# Patient Record
Sex: Female | Born: 1990 | Race: Black or African American | Hispanic: No | Smoking: Never smoker
Health system: Southern US, Community
[De-identification: ages and names within clinical notes are randomized; demographics above are authoritative.]

## PROBLEM LIST (undated history)

## (undated) DIAGNOSIS — J40 Bronchitis, not specified as acute or chronic: Secondary | ICD-10-CM

---

## 2012-07-17 ENCOUNTER — Encounter (HOSPITAL_COMMUNITY): Payer: Self-pay | Admitting: *Deleted

## 2012-07-17 ENCOUNTER — Emergency Department (HOSPITAL_COMMUNITY)
Admission: EM | Admit: 2012-07-17 | Discharge: 2012-07-17 | Disposition: A | Discharge status: Home / Self Care | Payer: BC Managed Care – PPO | Source: Home / Self Care | Attending: Family Medicine | Admitting: Family Medicine

## 2012-07-17 DIAGNOSIS — J40 Bronchitis, not specified as acute or chronic: Secondary | ICD-10-CM

## 2012-07-17 HISTORY — DX: Bronchitis, not specified as acute or chronic: J40

## 2012-07-17 MED ORDER — ALBUTEROL SULFATE HFA 108 (90 BASE) MCG/ACT IN AERS: 2.0000 | INHALATION_SPRAY | RESPIRATORY_TRACT | Status: DC | PRN

## 2012-07-17 MED ORDER — CETIRIZINE-PSEUDOEPHEDRINE ER 5-120 MG PO TB12: 1.0000 | ORAL_TABLET | Freq: Two times a day (BID) | ORAL | Status: AC

## 2012-07-17 MED ORDER — SALINE NASAL SPRAY 0.65 % NA SOLN: 1.0000 | NASAL | Status: DC | PRN

## 2012-07-17 NOTE — ED Provider Notes (Signed)
Medical screening examination/treatment/procedure(s) were performed by resident physician or non-physician practitioner and as supervising physician I was immediately available for consultation/collaboration.   Barkley Bruns MD.    Linna Hoff, MD 07/17/12 [phone]

## 2012-07-17 NOTE — ED Notes (Signed)
Pt with c/o cough/congestion/fever/body aches intermittent loss of voice onset Wednesday taking otc meds without relief -

## 2012-07-17 NOTE — ED Provider Notes (Signed)
History     CSN: [id number]  Arrival date & time 07/17/12  4098   First MD Initiated Contact with Patient 07/17/12 [phone]      Chief Complaint  Patient presents with  . Nasal Congestion  . Cough  . Fever  . Generalized Body Aches  . Laryngitis    (Consider location/radiation/quality/duration/timing/severity/associated sxs/prior treatment) Patient is a 21 y.o. female presenting with cough and fever. The history is provided by the patient.  Cough  Fever Primary symptoms of the febrile illness include fever and cough.  Christiona is a 21 y.o. female who complains of onset of cold symptoms for 3 days.  Symptoms not relieved with OTC medications. + sore throat + cough, non productive No pleuritic pain + wheezing + nasal congestion No post-nasal drainage + sinus pain/pressure + voice changes No chest congestion + itchy/red eyes No earache No hemoptysis + SOB + chills/sweats No fever No nausea No vomiting No abdominal pain No diarrhea No skin rashes No fatigue + myalgias No headache  No ill contacts   Past Medical History  Diagnosis Date  . Bronchitis     History reviewed. No pertinent past surgical history.  History reviewed. No pertinent family history.  History  Substance Use Topics  . Smoking status: Never Smoker   . Smokeless tobacco: Not on file  . Alcohol Use: No    OB History    Grav Para Term Preterm Abortions TAB SAB Ect Mult Living                  Review of Systems  Constitutional: Positive for fever.  Respiratory: Positive for cough.   All other systems reviewed and are negative.    Allergies  Review of patient's allergies indicates no known allergies.  Home Medications   Current Outpatient Rx  Name Route Sig Dispense Refill  . ACETAMINOPHEN 325 MG PO TABS Oral Take 650 mg by mouth every 6 (six) hours as needed.    Marland Kitchen ALKA-SELTZER PLUS ALLERGY PO Oral Take by mouth.    Marland Kitchen DEPO-PROVERA IM Intramuscular Inject into the muscle.    .  ALBUTEROL SULFATE HFA 108 (90 BASE) MCG/ACT IN AERS Inhalation Inhale 2 puffs into the lungs every 4 (four) hours as needed for wheezing. 1 Inhaler 0  . CETIRIZINE-PSEUDOEPHEDRINE ER 5-120 MG PO TB12 Oral Take 1 tablet by mouth 2 (two) times daily. 30 tablet 0  . SALINE NASAL SPRAY 0.65 % NA SOLN Nasal Place 1 spray into the nose as needed for congestion. 30 mL 12    BP 118/68  Pulse 110  Temp 99 F (37.2 C) (Oral)  Resp 22  SpO2 100%  Physical Exam  Nursing note and vitals reviewed. Constitutional: She is oriented to person, place, and time. Vital signs are normal. She appears well-developed and well-nourished. She is active and cooperative.  HENT:  Head: Normocephalic.  Right Ear: External ear normal.  Left Ear: External ear normal.  Nose: Rhinorrhea present. Right sinus exhibits no maxillary sinus tenderness and no frontal sinus tenderness. Left sinus exhibits no maxillary sinus tenderness and no frontal sinus tenderness.  Mouth/Throat: Posterior oropharyngeal erythema present. No oropharyngeal exudate.       Post nasal gtt  Eyes: Conjunctivae are normal. Pupils are equal, round, and reactive to light. No scleral icterus.  Neck: Trachea normal and normal range of motion. Neck supple.  Cardiovascular: Normal rate, regular rhythm and normal heart sounds.   Pulmonary/Chest: Effort normal. No respiratory distress. She has no wheezes.  She exhibits no tenderness.  Abdominal: Soft. Bowel sounds are normal. There is no tenderness.  Lymphadenopathy:    She has no cervical adenopathy.  Neurological: She is alert and oriented to person, place, and time. No cranial nerve deficit or sensory deficit.  Skin: Skin is warm and dry. No rash noted.  Psychiatric: She has a normal mood and affect. Her speech is normal and behavior is normal. Judgment and thought content normal. Cognition and memory are normal.    ED Course  Procedures (including critical care time)  Labs Reviewed - No data to  display No results found.   1. Bronchitis       MDM  Increase fluid intake, rest. Begin expectorant/decongestant, saline nasal spray and/or saline irrigation, and cough suppressant at bedtime. Antihistamines of your choice (Claritin or Zyrtec).  Albuterol as needed for cough and wheezing.  Tylenol or Motrin for fever/discomfort.  Followup with PCP if not improving 7 to 10 days.         Johnsie Kindred, NP 07/17/12 1056

## 2015-04-17 ENCOUNTER — Encounter (HOSPITAL_COMMUNITY): Payer: Self-pay | Admitting: Emergency Medicine

## 2015-04-17 ENCOUNTER — Emergency Department (HOSPITAL_COMMUNITY)
Admission: EM | Admit: 2015-04-17 | Discharge: 2015-04-17 | Disposition: A | Discharge status: Home / Self Care | Payer: BLUE CROSS/BLUE SHIELD | Source: Home / Self Care | Attending: Emergency Medicine | Admitting: Emergency Medicine

## 2015-04-17 DIAGNOSIS — J039 Acute tonsillitis, unspecified: Secondary | ICD-10-CM

## 2015-04-17 LAB — POCT INFECTIOUS MONO SCREEN: MONO SCREEN: NEGATIVE

## 2015-04-17 LAB — POCT RAPID STREP A: Streptococcus, Group A Screen (Direct): NEGATIVE

## 2015-04-17 MED ORDER — CLINDAMYCIN HCL 300 MG PO CAPS: 300.0000 mg | ORAL_CAPSULE | Freq: Three times a day (TID) | ORAL | Status: DC

## 2015-04-17 NOTE — ED Provider Notes (Signed)
CSN: [id number]     Arrival date & time 04/17/15  0800 History   First MD Initiated Contact with Patient 04/17/15 0809     Chief Complaint  Patient presents with  . Sore Throat   (Consider location/radiation/quality/duration/timing/severity/associated sxs/prior Treatment) HPI  She is a 24 year old woman here for evaluation of sore throat. Her symptoms started last night with sore throat and headache. She denies nasal congestion, rhinorrhea, cough. No fevers. No nausea or vomiting. It is quite painful to swallow. She saw white spots on her tonsils last night. She is able to tolerate sips of liquid. She has not tried anything. She had a similar episode about 2 weeks ago. At that time, she self medicated with Tylenol and ibuprofen in the sore throat resolved in about a week.  Past Medical History  Diagnosis Date  . Bronchitis    History reviewed. No pertinent past surgical history. History reviewed. No pertinent family history. History  Substance Use Topics  . Smoking status: Never Smoker   . Smokeless tobacco: Not on file  . Alcohol Use: No   OB History    No data available     Review of Systems As in history of present illness Allergies  Review of patient's allergies indicates no known allergies.  Home Medications   Prior to Admission medications   Medication Sig Start Date End Date Taking? Authorizing Provider  acetaminophen (TYLENOL) 325 MG tablet Take 650 mg by mouth every 6 (six) hours as needed.    Historical Provider, MD  albuterol (PROVENTIL HFA;VENTOLIN HFA) 108 (90 BASE) MCG/ACT inhaler Inhale 2 puffs into the lungs every 4 (four) hours as needed for wheezing. 07/17/12 07/17/13  Johnsie Kindredarmen L Chatten, NP  clindamycin (CLEOCIN) 300 MG capsule Take 1 capsule (300 mg total) by mouth 3 (three) times daily. 04/17/15   Charm RingsErin J Honig, MD  DiphenhydrAMINE HCl (ALKA-SELTZER PLUS ALLERGY PO) Take by mouth.    Historical Provider, MD  MedroxyPROGESTERone Acetate (DEPO-PROVERA IM) Inject into  the muscle.    Historical Provider, MD  sodium chloride (OCEAN NASAL SPRAY) 0.65 % nasal spray Place 1 spray into the nose as needed for congestion. 07/17/12 07/17/13  Katherine Bassetarmen L Chatten, NP   BP 141/94 mmHg  Pulse 113  Temp(Src) 98.4 F (36.9 C) (Oral)  Resp 16  SpO2 97% Physical Exam  Constitutional: She is oriented to person, place, and time. She appears well-developed and well-nourished. No distress.  HENT:  Nose: Nose normal.  Mouth/Throat: No oropharyngeal exudate.  Tonsils are enlarged and erythematous  Neck: Neck supple.  Cardiovascular: Regular rhythm and normal heart sounds.   No murmur heard. Mildly tachycardic  Pulmonary/Chest: Effort normal and breath sounds normal. No respiratory distress. She has no wheezes. She has no rales.  Lymphadenopathy:    She has cervical adenopathy (bilateral anterior chain).  Neurological: She is alert and oriented to person, place, and time.    ED Course  Procedures (including critical care time) Labs Review Labs Reviewed  POCT RAPID STREP A  POCT INFECTIOUS MONO SCREEN    Imaging Review No results found.   MDM   1. Tonsillitis    Rapid strep and mono screen are negative. I'm concerned for atypical bacterial tonsillitis. Will treat with clindamycin for 7 days. Discussed live culture yogurt or probiotic to help with diarrhea. Symptomatic care discussed as in after visit summary. Follow-up as needed.    Charm RingsErin J Honig, MD 04/17/15 [phone]

## 2015-04-17 NOTE — ED Notes (Signed)
C/o sore throat since yesterday for a couple of weeks now States she has white patches on the back of throat otc meds used as tx

## 2015-04-17 NOTE — Discharge Instructions (Signed)
You have an infection of your tonsils. Take clindamycin 3 times a day for the next 7 days. Eat a live culture yogurt daily or take a probiotic while on clindamycin to decrease the risk of diarrhea. Use Chloraseptic spray every one to 2 hours for throat pain. You can also do saltwater gargles and tea with honey. Follow-up as needed.

## 2015-04-19 LAB — CULTURE, GROUP A STREP: STREP A CULTURE: POSITIVE — AB

## 2015-04-22 NOTE — ED Notes (Signed)
Final report of strep positive. Treatment adequate w Rx provided.

## 2015-05-23 ENCOUNTER — Emergency Department (HOSPITAL_COMMUNITY)
Admission: EM | Admit: 2015-05-23 | Discharge: 2015-05-23 | Disposition: A | Discharge status: Home / Self Care | Payer: BLUE CROSS/BLUE SHIELD | Source: Home / Self Care | Attending: Family Medicine | Admitting: Family Medicine

## 2015-05-23 ENCOUNTER — Encounter (HOSPITAL_COMMUNITY): Payer: Self-pay | Admitting: Emergency Medicine

## 2015-05-23 DIAGNOSIS — J020 Streptococcal pharyngitis: Secondary | ICD-10-CM

## 2015-05-23 LAB — POCT RAPID STREP A: Streptococcus, Group A Screen (Direct): NEGATIVE

## 2015-05-23 MED ORDER — CEFDINIR 300 MG PO CAPS: 300.0000 mg | ORAL_CAPSULE | Freq: Two times a day (BID) | ORAL | Status: DC

## 2015-05-23 NOTE — ED Provider Notes (Signed)
CSN: [id number]     Arrival date & time 05/23/15  1946 History   First MD Initiated Contact with Patient 05/23/15 2110     Chief Complaint  Patient presents with  . Sore Throat  . GI Problem   (Consider location/radiation/quality/duration/timing/severity/associated sxs/prior Treatment) Patient is a 24 y.o. female presenting with pharyngitis and GI illness. The history is provided by the patient.  Sore Throat This is a recurrent problem. The current episode started yesterday. The problem has not changed since onset.Associated symptoms include abdominal pain.  GI Problem Associated symptoms include abdominal pain.    Past Medical History  Diagnosis Date  . Bronchitis    History reviewed. No pertinent past surgical history. No family history on file. History  Substance Use Topics  . Smoking status: Never Smoker   . Smokeless tobacco: Not on file  . Alcohol Use: No   OB History    No data available     Review of Systems  Constitutional: Positive for appetite change. Negative for fever and chills.  HENT: Negative.   Gastrointestinal: Positive for nausea, vomiting, abdominal pain and diarrhea.  Musculoskeletal: Negative.   Skin: Negative.     Allergies  Review of patient's allergies indicates no known allergies.  Home Medications   Prior to Admission medications   Medication Sig Start Date End Date Taking? Authorizing Provider  acetaminophen (TYLENOL) 325 MG tablet Take 650 mg by mouth every 6 (six) hours as needed.    Historical Provider, MD  albuterol (PROVENTIL HFA;VENTOLIN HFA) 108 (90 BASE) MCG/ACT inhaler Inhale 2 puffs into the lungs every 4 (four) hours as needed for wheezing. 07/17/12 07/17/13  Johnsie Kindredarmen L Chatten, NP  cefdinir (OMNICEF) 300 MG capsule Take 1 capsule (300 mg total) by mouth 2 (two) times daily. 05/23/15   Linna HoffJames D Lahari Suttles, MD  clindamycin (CLEOCIN) 300 MG capsule Take 1 capsule (300 mg total) by mouth 3 (three) times daily. 04/17/15   Charm RingsErin J Honig, MD   DiphenhydrAMINE HCl (ALKA-SELTZER PLUS ALLERGY PO) Take by mouth.    Historical Provider, MD  MedroxyPROGESTERone Acetate (DEPO-PROVERA IM) Inject into the muscle.    Historical Provider, MD  sodium chloride (OCEAN NASAL SPRAY) 0.65 % nasal spray Place 1 spray into the nose as needed for congestion. 07/17/12 07/17/13  Katherine Bassetarmen L Chatten, NP   BP 124/71 mmHg  Pulse 113  Temp(Src) 98.7 F (37.1 C) (Oral)  Resp 16  SpO2 100% Physical Exam  Constitutional: She is oriented to person, place, and time. She appears well-developed and well-nourished. No distress.  HENT:  Right Ear: External ear normal.  Left Ear: External ear normal.  Mouth/Throat: Uvula is midline and mucous membranes are normal. Oropharyngeal exudate and posterior oropharyngeal erythema present.  Neck: Normal range of motion. Neck supple.  Lymphadenopathy:    She has cervical adenopathy.  Neurological: She is alert and oriented to person, place, and time.  Skin: Skin is warm and dry.  Nursing note and vitals reviewed.   ED Course  Procedures (including critical care time) Labs Review Labs Reviewed  POCT RAPID STREP A  strep neg.  Imaging Review No results found.   MDM   1. Strep sore throat       Linna HoffJames D Bri Wakeman, MD 05/23/15 2150

## 2015-05-23 NOTE — ED Notes (Signed)
Pt comes in with tonsillitis that started last night followed with GI sx's Diarrhea and episode of vomiting noted No fever,chills noted  Afebrile

## 2015-05-23 NOTE — Discharge Instructions (Signed)
Drink lots of fluids, take all of medicine, use lozenges as needed.return if needed or see ent specialist noted.

## 2015-05-26 LAB — CULTURE, GROUP A STREP: STREP A CULTURE: POSITIVE — AB

## 2015-05-27 NOTE — ED Notes (Signed)
Final report of culture positive for strep, treatment adequate w omnicef

## 2020-05-13 ENCOUNTER — Other Ambulatory Visit: Payer: Self-pay

## 2020-05-13 ENCOUNTER — Encounter (HOSPITAL_BASED_OUTPATIENT_CLINIC_OR_DEPARTMENT_OTHER): Payer: Self-pay | Admitting: *Deleted

## 2020-05-13 ENCOUNTER — Emergency Department (HOSPITAL_COMMUNITY)
Admission: EM | Admit: 2020-05-13 | Discharge: 2020-05-13 | Discharge status: Against Medical Advice | Payer: No Typology Code available for payment source

## 2020-05-13 ENCOUNTER — Emergency Department (HOSPITAL_BASED_OUTPATIENT_CLINIC_OR_DEPARTMENT_OTHER): Payer: BC Managed Care – PPO

## 2020-05-13 DIAGNOSIS — M542 Cervicalgia: Secondary | ICD-10-CM | POA: Diagnosis not present

## 2020-05-13 DIAGNOSIS — M25511 Pain in right shoulder: Secondary | ICD-10-CM | POA: Diagnosis not present

## 2020-05-13 DIAGNOSIS — Y939 Activity, unspecified: Secondary | ICD-10-CM | POA: Insufficient documentation

## 2020-05-13 DIAGNOSIS — M549 Dorsalgia, unspecified: Secondary | ICD-10-CM | POA: Diagnosis present

## 2020-05-13 DIAGNOSIS — Y999 Unspecified external cause status: Secondary | ICD-10-CM | POA: Diagnosis not present

## 2020-05-13 DIAGNOSIS — M25512 Pain in left shoulder: Secondary | ICD-10-CM | POA: Diagnosis not present

## 2020-05-13 DIAGNOSIS — Y92410 Unspecified street and highway as the place of occurrence of the external cause: Secondary | ICD-10-CM | POA: Diagnosis not present

## 2020-05-13 LAB — PREGNANCY, URINE: Preg Test, Ur: NEGATIVE
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
## 2020-05-13 NOTE — ED Notes (Signed)
Pt stated she would  Go be seen elsewhere and has let the premises.

## 2020-05-13 NOTE — ED Notes (Signed)
Pt did not respond to name being called for triage

## 2020-05-13 NOTE — ED Notes (Signed)
Pt checked out against medical advise.

## 2020-05-14 ENCOUNTER — Encounter (HOSPITAL_BASED_OUTPATIENT_CLINIC_OR_DEPARTMENT_OTHER): Payer: Self-pay | Admitting: Emergency Medicine

## 2020-05-14 ENCOUNTER — Emergency Department (HOSPITAL_BASED_OUTPATIENT_CLINIC_OR_DEPARTMENT_OTHER)
Admission: EM | Admit: 2020-05-14 | Discharge: 2020-05-14 | Disposition: A | Discharge status: Home / Self Care | Payer: BC Managed Care – PPO | Attending: Emergency Medicine | Admitting: Emergency Medicine

## 2020-05-14 MED ORDER — NAPROXEN 250 MG PO TABS
500.0000 mg | ORAL_TABLET | Freq: Once | ORAL | Status: AC
  Administered 2020-05-14: 500 mg via ORAL
  Filled 2020-05-14: qty 2

## 2020-05-14 MED ORDER — METHOCARBAMOL 500 MG PO TABS: 500.0000 mg | ORAL_TABLET | Freq: Two times a day (BID) | ORAL | 0 refills | Status: DC

## 2020-05-14 MED ORDER — NAPROXEN 375 MG PO TABS: 375.0000 mg | ORAL_TABLET | Freq: Two times a day (BID) | ORAL | 0 refills | Status: DC

## 2020-05-14 MED ORDER — METHOCARBAMOL 500 MG PO TABS
500.0000 mg | ORAL_TABLET | Freq: Once | ORAL | Status: AC
  Administered 2020-05-14: 500 mg via ORAL
  Filled 2020-05-14: qty 1

## 2020-05-14 MED ORDER — ACETAMINOPHEN 500 MG PO TABS
1000.0000 mg | ORAL_TABLET | Freq: Once | ORAL | Status: AC
  Administered 2020-05-14: 1000 mg via ORAL
  Filled 2020-05-14: qty 2

## 2020-05-14 MED ORDER — LIDOCAINE 5 % EX PTCH
1.0000 | MEDICATED_PATCH | CUTANEOUS | Status: DC
  Administered 2020-05-14: 1 via TRANSDERMAL
  Filled 2020-05-14: qty 1

## 2020-05-14 MED ORDER — LIDOCAINE 5 % EX PTCH: 1.0000 | MEDICATED_PATCH | CUTANEOUS | 0 refills | Status: DC
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
## 2020-09-05 ENCOUNTER — Ambulatory Visit: Payer: BC Managed Care – PPO | Admitting: Family Medicine

## 2020-09-05 ENCOUNTER — Encounter: Payer: Self-pay | Admitting: Family Medicine

## 2020-09-05 ENCOUNTER — Other Ambulatory Visit: Payer: Self-pay

## 2020-09-05 VITALS — BP 130/82 | HR 108 | Temp 98.5°F | Wt 187.0 lb

## 2020-09-05 DIAGNOSIS — M5442 Lumbago with sciatica, left side: Secondary | ICD-10-CM

## 2020-09-05 DIAGNOSIS — M542 Cervicalgia: Secondary | ICD-10-CM

## 2020-09-05 DIAGNOSIS — R519 Headache, unspecified: Secondary | ICD-10-CM | POA: Diagnosis not present

## 2020-09-05 DIAGNOSIS — G8929 Other chronic pain: Secondary | ICD-10-CM

## 2020-09-05 MED ORDER — METHOCARBAMOL 500 MG PO TABS: 500.0000 mg | ORAL_TABLET | Freq: Two times a day (BID) | ORAL | 0 refills | Status: AC

## 2020-09-05 MED ORDER — IBUPROFEN 800 MG PO TABS: 800.0000 mg | ORAL_TABLET | Freq: Three times a day (TID) | ORAL | 0 refills | Status: AC | PRN
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
## 2020-09-05 NOTE — Patient Instructions (Signed)
Take the 800 mg ibuprofen 3 times daily with food until you see the orthopedist.  You can use Robaxin as needed for moderate or severe pain.  Continue using heat or ice to the areas that are bothering you.

## 2020-09-11 ENCOUNTER — Encounter: Payer: Self-pay | Admitting: Family Medicine

## 2020-09-11 ENCOUNTER — Other Ambulatory Visit: Payer: Self-pay

## 2020-09-11 ENCOUNTER — Ambulatory Visit: Payer: BC Managed Care – PPO | Admitting: Family Medicine

## 2020-09-11 DIAGNOSIS — M545 Low back pain, unspecified: Secondary | ICD-10-CM | POA: Diagnosis not present

## 2020-09-11 DIAGNOSIS — M5412 Radiculopathy, cervical region: Secondary | ICD-10-CM | POA: Diagnosis not present

## 2020-09-11 DIAGNOSIS — M542 Cervicalgia: Secondary | ICD-10-CM

## 2020-09-11 DIAGNOSIS — G8929 Other chronic pain: Secondary | ICD-10-CM
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
## 2020-09-27 ENCOUNTER — Ambulatory Visit
Admission: RE | Admit: 2020-09-27 | Discharge: 2020-09-27 | Disposition: A | Discharge status: Home / Self Care | Payer: BC Managed Care – PPO | Source: Ambulatory Visit | Attending: Family Medicine | Admitting: Family Medicine

## 2020-09-27 DIAGNOSIS — M5412 Radiculopathy, cervical region: Secondary | ICD-10-CM

## 2020-09-27 DIAGNOSIS — M542 Cervicalgia: Secondary | ICD-10-CM

## 2020-09-30 ENCOUNTER — Telehealth: Payer: Self-pay | Admitting: Family Medicine

## 2020-09-30 NOTE — Telephone Encounter (Signed)
MRI of the cervical spine looks good, no ruptured discs, pinched nerves, or degenerative changes.

## 2020-12-05 ENCOUNTER — Other Ambulatory Visit: Payer: Self-pay

## 2020-12-05 ENCOUNTER — Telehealth: Payer: Self-pay | Admitting: Family Medicine

## 2020-12-05 ENCOUNTER — Encounter: Payer: Self-pay | Admitting: Family Medicine

## 2020-12-05 ENCOUNTER — Telehealth: Payer: Self-pay

## 2020-12-05 ENCOUNTER — Telehealth: Payer: BC Managed Care – PPO | Admitting: Family Medicine

## 2020-12-05 ENCOUNTER — Other Ambulatory Visit (INDEPENDENT_AMBULATORY_CARE_PROVIDER_SITE_OTHER): Payer: BC Managed Care – PPO

## 2020-12-05 VITALS — Temp 100.0°F

## 2020-12-05 DIAGNOSIS — J029 Acute pharyngitis, unspecified: Secondary | ICD-10-CM

## 2020-12-05 DIAGNOSIS — R509 Fever, unspecified: Secondary | ICD-10-CM

## 2020-12-05 DIAGNOSIS — R52 Pain, unspecified: Secondary | ICD-10-CM

## 2020-12-05 LAB — POC COVID19 BINAXNOW: SARS Coronavirus 2 Ag: NEGATIVE

## 2020-12-05 LAB — POC INFLUENZA A&B (BINAX/QUICKVUE)
Influenza A, POC: NEGATIVE
Influenza B, POC: NEGATIVE

## 2020-12-05 LAB — POCT RAPID STREP A (OFFICE): Rapid Strep A Screen: NEGATIVE

## 2020-12-05 NOTE — Telephone Encounter (Signed)
I called pt. To let her know her lab results and recommendations to go to urgent care or the ER if symptoms get any worse.

## 2020-12-05 NOTE — Telephone Encounter (Signed)
Pt called back after her appt and states that he looked back and she does have a history of strep throat. Pt can be reached at [phone].

## 2020-12-05 NOTE — Progress Notes (Signed)
   Subjective:  Documentation for virtual audio and video telecommunications through Caregility encounter:  The patient was located at home. 2 patient identifiers used.  The provider was located in the office. The patient did consent to this visit and is aware of possible charges through their insurance for this visit.  The other persons participating in this telemedicine service were none. Time spent on call was 16 minutes and in review of previous records 20 minutes total.  This virtual service is not related to other E/M service within previous 7 days.   Patient ID: Helen Arnold, female    DOB: 22-Jun-1991, 30 y.o.   MRN: [id number]  HPI Chief Complaint  Patient presents with  . possible covid    Negative rapid negative test. Sore throat- x few days, body aches- everything hurts on body. fever   Complains of 2 day history of acute onset of fever, chills, body aches, headache, and severe sore throat.   No rhinorrhea, nasal congestion, sinus pain, ear pain, chest pain, shortness of breath, abdominal pain, N/V/D.   States she has been taking Elderberry, vitamin C, Tylenol 1,000 mg, and Excedrin.   She did not get her flu or Covid vaccines.   States she had a negative rapid covid test at home.   LMP: currently on her cycle. No chance of pregnancy   Reviewed allergies, medications, past medical, surgical, family, and social history.   Review of Systems Pertinent positives and negatives in the history of present illness.     Objective:   Physical Exam Temp 100 F (37.8 C)   She appears to be in distress due to pain. Respirations unlabored. Normal voice.       Assessment & Plan:  Fever and chills - Plan: POC COVID-19 BinaxNow, Novel Coronavirus, NAA ([facility]), POC Influenza A&B(BINAX/QUICKVUE), POCT rapid strep A  Body aches - Plan: POC COVID-19 BinaxNow, Novel Coronavirus, NAA ([facility]), POC Influenza A&B(BINAX/QUICKVUE), POCT rapid strep A  Acute pharyngitis,  unspecified etiology - Plan: POC COVID-19 BinaxNow, Novel Coronavirus, NAA ([facility]), POC Influenza A&B(BINAX/QUICKVUE), POCT rapid strep A  She will come to the office parking lot for testing. Rapid Covid, flu, strep and PCR Covid if negative rapid covid test.  Discussed symptomatic treatment and when to seek immediate medical attention at an UC or ED.

## 2020-12-06 ENCOUNTER — Telehealth: Payer: Self-pay

## 2020-12-06 NOTE — Telephone Encounter (Signed)
Pt was advised to wait on the pcr results. In the mean time treat her symptoms and stay in. Pt was also told to call back on Monday if things take a turn for the worse. KH

## 2020-12-07 LAB — SARS-COV-2, NAA 2 DAY TAT

## 2020-12-07 LAB — NOVEL CORONAVIRUS, NAA: SARS-CoV-2, NAA: NOT DETECTED

## 2020-12-10 ENCOUNTER — Encounter: Payer: Self-pay | Admitting: Family Medicine

## 2020-12-11 ENCOUNTER — Institutional Professional Consult (permissible substitution): Payer: BC Managed Care – PPO | Admitting: Neurology

## 2021-01-09 ENCOUNTER — Encounter: Payer: Self-pay | Admitting: Neurology

## 2021-01-09 ENCOUNTER — Ambulatory Visit: Payer: BC Managed Care – PPO | Admitting: Neurology

## 2021-01-09 ENCOUNTER — Other Ambulatory Visit: Payer: Self-pay

## 2021-01-09 VITALS — BP 112/84 | HR 118 | Ht 61.0 in | Wt 193.0 lb

## 2021-01-09 DIAGNOSIS — G4700 Insomnia, unspecified: Secondary | ICD-10-CM

## 2021-01-09 DIAGNOSIS — R0683 Snoring: Secondary | ICD-10-CM | POA: Diagnosis not present

## 2021-01-09 DIAGNOSIS — J351 Hypertrophy of tonsils: Secondary | ICD-10-CM

## 2021-01-09 DIAGNOSIS — Z9189 Other specified personal risk factors, not elsewhere classified: Secondary | ICD-10-CM

## 2021-01-09 DIAGNOSIS — E669 Obesity, unspecified: Secondary | ICD-10-CM

## 2021-01-09 NOTE — Progress Notes (Signed)
Subjective:    Patient ID: Helen Arnold is a 30 y.o. female.  HPI      Helen Foley, MD, PhD [facility] [address] [address] [place], [place] [address]  Dear Dr. Cherly Arnold,   I saw your patient, Helen Arnold, upon your kind request in my sleep clinic today for initial consultation of her sleep disorder, in particular, The patient is unaccompanied today.  As you know, Helen Arnold is a 30 year old right-handed woman with an underlying medical history of bronchitis, anxiety, recurrent sore throat, tonsillar hypertrophy, and obesity, who reports chronic difficulty initiating sleep for years.  She has been taking over-the-counter medications such as ZzzQuil or Unisom for the past few years, typically alternates and typically takes 1 or the other on a nightly basis.  She falls asleep well after taking the medication.  She admits that she currently does not have a very set schedule.  She has always been a night owl even as a child.  She goes to bed around 2 AM and rise time is around 1130 or noon.  She lives with her fianc.  She is on long-term disability.  She has no children and no pets in the house.  She has had tonsillar hypertrophy and recurrent tonsillitis, had a consultation with ENT in or around 2014.  She was not encouraged to pursue tonsillectomy.  She has no family history of sleep apnea or insomnia.  She does not have night to night nocturia or recurrent morning headaches.  She is working on weight loss.  She has tried melatonin in the past but did not find it helpful.  She does report stress and anxiety and difficulty shutting down at night.  She recently started counseling, had her first session and thought it was beneficial.  She is not currently on any medication for anxiety.  I reviewed your office note from 10/24/20. Her Epworth sleepiness score is 7 out of 24, fatigue severity 4 is 48 out of 63.  She does endorse snoring. She does not smoke or drink  alcohol, she does not drink caffeine daily.  She drinks occasional tea.  Her Past Medical History Is Significant For: Past Medical History:  Diagnosis Date  . Bronchitis     Her Past Surgical History Is Significant For: No past surgical history on file.  Her Family History Is Significant For: No family history on file.  Her Social History Is Significant For: Social History   Socioeconomic History  . Marital status: Significant Other    Spouse name: Not on file  . Number of children: Not on file  . Years of education: Not on file  . Highest education level: Not on file  Occupational History  . Not on file  Tobacco Use  . Smoking status: Never Smoker  . Smokeless tobacco: Never Used  Substance and Sexual Activity  . Alcohol use: Yes    Comment: occasional   . Drug use: No  . Sexual activity: Not on file  Other Topics Concern  . Not on file  Social History Narrative  . Not on file   Social Determinants of Health   Financial Resource Strain: Not on file  Food Insecurity: Not on file  Transportation Needs: Not on file  Physical Activity: Not on file  Stress: Not on file  Social Connections: Not on file    Her Allergies Are:  No Known Allergies:   Her Current Medications Are:  Outpatient Encounter Medications as of 01/09/2021  Medication Sig  . acetaminophen (TYLENOL) 325 MG tablet Take 650 mg by mouth every 6 (six) hours as needed.  Marland Kitchen ibuprofen (ADVIL) 800 MG tablet Take 1 tablet (800 mg total) by mouth every 8 (eight) hours as needed.  . methocarbamol (ROBAXIN) 500 MG tablet Take 1 tablet (500 mg total) by mouth 2 (two) times daily.   No facility-administered encounter medications on file as of 01/09/2021.  :  Review of Systems:  Out of a complete 14 point review of systems, all are reviewed and negative with the exception of these symptoms as listed below: Review of Systems  Neurological:       Here for sleep consult. No prior sleep study, reports she does  snore at night. Reports she does typically take a sleep aid to help her fall asleep at night.  Epworth Sleepiness Scale 0= would never doze 1= slight chance of dozing 2= moderate chance of dozing 3= high chance of dozing  Sitting and reading:2 Watching TV:2 Sitting inactive in a public place (ex. Theater or meeting):0 As a passenger in a car for an hour without a break:2 Lying down to rest in the afternoon:1 Sitting and talking to someone:0 Sitting quietly after lunch (no alcohol):0 In a car, while stopped in traffic:0 Total:7     Objective:  Neurological Exam  Physical Exam Physical Examination:   Vitals:   01/09/21 1519  BP: 112/84  Pulse: (!) 118  SpO2: 98%   General Examination: The patient is a very pleasant 30 y.o. female in no acute distress. She appears well-developed and well-nourished and well groomed.   HEENT: Normocephalic, atraumatic, pupils are equal, round and reactive to light, extraocular tracking is good without limitation to gaze excursion or nystagmus noted. Hearing is grossly intact. Face is symmetric with normal facial animation. Speech is clear with no dysarthria noted. There is no hypophonia. There is no lip, neck/head, jaw or voice tremor. Neck is supple with full range of passive and active motion. There are no carotid bruits on auscultation. Oropharynx exam reveals: mild mouth dryness, adequate dental hygiene and moderate airway crowding, due to tonsillar size of about 3+, Mallampati class II, smaller airway entry noted.  Tongue protrudes centrally and palate elevates symmetrically.  Neck circumference of 14-5/8 inches.  She has a mild overbite.  Nasal inspection reveals mild inferior turbinate hypertrophy bilaterally.  No deviated septum noted.  Chest: Clear to auscultation without wheezing, rhonchi or crackles noted.  Heart: S1+S2+0, regular and normal without murmurs, rubs or gallops noted.   Abdomen: Soft, non-tender and non-distended with normal  bowel sounds appreciated on auscultation.  Extremities: There is no pitting edema in the distal lower extremities bilaterally.   Skin: Warm and dry without trophic changes noted.   Musculoskeletal: exam reveals no obvious joint deformities, tenderness or joint swelling or erythema.   Neurologically:  Mental status: The patient is awake, alert and oriented in all 4 spheres. Her immediate and remote memory, attention, language skills and fund of knowledge are appropriate. There is no evidence of aphasia, agnosia, apraxia or anomia. Speech is clear with normal prosody and enunciation. Thought process is linear. Mood is normal and affect is normal.  Cranial nerves II - XII are as described above under HEENT exam.  Motor exam: Normal bulk, strength and tone is noted. There is no tremor, Romberg is negative. Fine motor skills and coordination: grossly intact.  Cerebellar testing: No dysmetria or intention tremor. There is no truncal or gait ataxia.  Sensory exam:  intact to light touch in the upper and lower extremities.  Gait, station and balance: She stands easily. No veering to one side is noted. No leaning to one side is noted. Posture is age-appropriate and stance is narrow based. Gait shows normal stride length and normal pace. No problems turning are noted. Tandem walk is unremarkable.               Assessment and Plan:   In summary, Getsemani Lindon is a very pleasant 30 y.o.-year old female with an underlying medical history of bronchitis, anxiety, recurrent sore throat, tonsillar hypertrophy, and obesity, who presents for evaluation of her sleep disorder.  Her history and examination suggest high risk for underlying obstructive sleep apnea (OSA). I had a long chat with the patient about my findings and the diagnosis of OSA, its prognosis and treatment options. We talked about medical treatments, surgical interventions and non-pharmacological approaches. I explained in particular the risks and  ramifications of untreated moderate to severe OSA, especially with respect to developing cardiovascular disease down the Road, including congestive heart failure, difficult to treat hypertension, cardiac arrhythmias, or stroke. Even type 2 diabetes has, in part, been linked to untreated OSA. Symptoms of untreated OSA include daytime sleepiness, memory problems, mood irritability and mood disorder such as depression and anxiety, lack of energy, as well as recurrent headaches, especially morning headaches. We talked about trying to maintain a healthy lifestyle in general, as well as the importance of weight control. We also talked about the importance of good sleep hygiene. I recommended the following at this time: sleep study.  I explained the sleep test procedure to the patient and also outlined possible surgical and non-surgical treatment options of OSA, including the use of a custom-made dental device (which would require a referral to a specialist dentist or oral surgeon), upper airway surgical options, such as traditional UPPP or a novel less invasive surgical option in the form of Inspire hypoglossal nerve stimulation (which would involve a referral to an ENT surgeon). I also explained the CPAP treatment option to the patient, who indicated that she would be willing to try CPAP if the need arises. I explained the importance of being compliant with PAP treatment, not only for insurance purposes but primarily to improve Her symptoms, and for the patient's long term health benefit, including to reduce Her cardiovascular risks. I answered all her questions today and the patient was in agreement. I plan to see her back after the sleep study is completed and encouraged her to call with any interim questions, concerns, problems or updates.   Thank you very much for allowing me to participate in the care of this nice patient. If I can be of any further assistance to you please do not hesitate to call me at  [phone].  Sincerely,   Helen Foley, MD, PhD

## 2021-01-09 NOTE — Patient Instructions (Signed)

## 2021-01-15 ENCOUNTER — Telehealth: Payer: Self-pay

## 2021-01-15 NOTE — Telephone Encounter (Signed)
LVM for pt to call me back to schedule sleep study  

## 2021-01-21 ENCOUNTER — Telehealth: Payer: Self-pay

## 2021-01-21 NOTE — Telephone Encounter (Signed)
Calling to schedule sleep study. 

## 2021-01-23 ENCOUNTER — Telehealth: Payer: Self-pay

## 2021-01-23 NOTE — Telephone Encounter (Signed)
We have attempted to call the patient two times to schedule sleep study.  Patient has been unavailable at the phone numbers we have on file and has not returned our calls. If patient calls back we will schedule them for their sleep study.  
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
## 2021-11-07 ENCOUNTER — Other Ambulatory Visit (HOSPITAL_COMMUNITY): Payer: Self-pay

## 2021-11-07 DIAGNOSIS — J209 Acute bronchitis, unspecified: Secondary | ICD-10-CM | POA: Diagnosis not present

## 2021-11-07 MED ORDER — PREDNISONE 5 MG (21) PO TBPK
ORAL_TABLET | ORAL | 0 refills | Status: AC
  Filled 2021-11-07: qty 21, 6d supply, fill #0

## 2021-11-07 MED ORDER — ALBUTEROL SULFATE HFA 108 (90 BASE) MCG/ACT IN AERS
INHALATION_SPRAY | RESPIRATORY_TRACT | 0 refills | Status: AC
  Filled 2021-11-07: qty 18, 17d supply, fill #0

## 2021-11-07 MED ORDER — PROMETHAZINE-DM 6.25-15 MG/5ML PO SYRP
ORAL_SOLUTION | ORAL | 0 refills | Status: AC
  Filled 2021-11-07: qty 120, 4d supply, fill #0

## 2021-12-16 DIAGNOSIS — Z01419 Encounter for gynecological examination (general) (routine) without abnormal findings: Secondary | ICD-10-CM | POA: Diagnosis not present

## 2022-07-22 ENCOUNTER — Encounter: Payer: Self-pay | Admitting: Internal Medicine

## 2022-08-25 ENCOUNTER — Encounter: Payer: Self-pay | Admitting: Internal Medicine

## 2022-09-29 ENCOUNTER — Encounter: Payer: Self-pay | Admitting: Internal Medicine

## 2023-11-28 ENCOUNTER — Emergency Department (HOSPITAL_COMMUNITY)
Admission: EM | Admit: 2023-11-28 | Discharge: 2023-11-29 | Disposition: A | Discharge status: Home / Self Care | Payer: PRIVATE HEALTH INSURANCE | Attending: Emergency Medicine | Admitting: Emergency Medicine

## 2023-11-28 ENCOUNTER — Encounter (HOSPITAL_COMMUNITY): Payer: Self-pay | Admitting: Emergency Medicine

## 2023-11-28 DIAGNOSIS — R1030 Lower abdominal pain, unspecified: Secondary | ICD-10-CM | POA: Diagnosis present

## 2023-11-28 DIAGNOSIS — R109 Unspecified abdominal pain: Secondary | ICD-10-CM

## 2023-11-28 DIAGNOSIS — N390 Urinary tract infection, site not specified: Secondary | ICD-10-CM | POA: Insufficient documentation

## 2023-11-28 LAB — COMPREHENSIVE METABOLIC PANEL
ALT: 23 U/L (ref 0–44)
AST: 16 U/L (ref 15–41)
Albumin: 4.4 g/dL (ref 3.5–5.0)
Alkaline Phosphatase: 92 U/L (ref 38–126)
Anion gap: 13 (ref 5–15)
BUN: 12 mg/dL (ref 6–20)
CO2: 22 mmol/L (ref 22–32)
Calcium: 9.1 mg/dL (ref 8.9–10.3)
Chloride: 104 mmol/L (ref 98–111)
Creatinine, Ser: 0.71 mg/dL (ref 0.44–1.00)
GFR, Estimated: >60 mL/min (ref >60)
Glucose, Bld: 89 mg/dL (ref 70–99)
Potassium: 3.4 mmol/L — ABNORMAL LOW (ref 3.5–5.1)
Sodium: 139 mmol/L (ref 135–145)
Total Bilirubin: 0.7 mg/dL (ref 0.0–1.2)
Total Protein: 8.8 g/dL — ABNORMAL HIGH (ref 6.5–8.1)

## 2023-11-28 LAB — URINALYSIS, ROUTINE W REFLEX MICROSCOPIC
Bilirubin Urine: NEGATIVE
Glucose, UA: NEGATIVE mg/dL
Ketones, ur: 20 mg/dL — AB
Nitrite: POSITIVE — AB
Protein, ur: 100 mg/dL — AB
Specific Gravity, Urine: 1.024 (ref 1.005–1.030)
WBC, UA: >50 WBC/hpf (ref 0–5)
pH: 5 (ref 5.0–8.0)

## 2023-11-28 LAB — CBC WITH DIFFERENTIAL/PLATELET
Abs Immature Granulocytes: 0.01 10*3/uL (ref 0.00–0.07)
Basophils Absolute: 0 10*3/uL (ref 0.0–0.1)
Basophils Relative: 0 %
Eosinophils Absolute: 0.2 10*3/uL (ref 0.0–0.5)
Eosinophils Relative: 3 %
HCT: 42.8 % (ref 36.0–46.0)
Hemoglobin: 14.6 g/dL (ref 12.0–15.0)
Immature Granulocytes: 0 %
Lymphocytes Relative: 30 %
Lymphs Abs: 1.9 10*3/uL (ref 0.7–4.0)
MCH: 31.9 pg (ref 26.0–34.0)
MCHC: 34.1 g/dL (ref 30.0–36.0)
MCV: 93.7 fL (ref 80.0–100.0)
Monocytes Absolute: 0.6 10*3/uL (ref 0.1–1.0)
Monocytes Relative: 10 %
Neutro Abs: 3.7 10*3/uL (ref 1.7–7.7)
Neutrophils Relative %: 57 %
Platelets: 354 10*3/uL (ref 150–400)
RBC: 4.57 MIL/uL (ref 3.87–5.11)
RDW: 12.6 % (ref 11.5–15.5)
WBC: 6.5 10*3/uL (ref 4.0–10.5)
nRBC: 0 % (ref 0.0–0.2)

## 2023-11-28 LAB — HCG, SERUM, QUALITATIVE: Preg, Serum: NEGATIVE

## 2023-11-28 NOTE — ED Triage Notes (Signed)
 Pt here from home with c/o low back pain and painful urination that has been ongoing for weeks , has been on antibiotics for an uti with minimal relief , has a ct scan tomorrow but did not want to wait do to the pain

## 2023-11-29 ENCOUNTER — Emergency Department (HOSPITAL_COMMUNITY): Payer: PRIVATE HEALTH INSURANCE

## 2023-11-29 MED ORDER — HYDROCODONE-ACETAMINOPHEN 5-325 MG PO TABS: 1.0000 | ORAL_TABLET | Freq: Four times a day (QID) | ORAL | 0 refills | Status: AC | PRN

## 2023-11-29 MED ORDER — ONDANSETRON 4 MG PO TBDP: 4.0000 mg | ORAL_TABLET | Freq: Three times a day (TID) | ORAL | 0 refills | Status: AC | PRN

## 2023-11-29 MED ORDER — KETOROLAC TROMETHAMINE 15 MG/ML IJ SOLN
15.0000 mg | Freq: Once | INTRAMUSCULAR | Status: AC
Start: 2023-11-29 — End: 2023-11-29
  Administered 2023-11-29: 15 mg via INTRAVENOUS
  Filled 2023-11-29: qty 1

## 2023-11-29 MED ORDER — CIPROFLOXACIN HCL 500 MG PO TABS
500.0000 mg | ORAL_TABLET | Freq: Once | ORAL | Status: AC
  Administered 2023-11-29: 500 mg via ORAL
  Filled 2023-11-29: qty 1

## 2023-11-29 MED ORDER — IOHEXOL 300 MG/ML  SOLN
100.0000 mL | Freq: Once | INTRAMUSCULAR | Status: AC | PRN
  Administered 2023-11-29: 100 mL via INTRAVENOUS

## 2023-11-29 MED ORDER — CIPROFLOXACIN HCL 500 MG PO TABS: 500.0000 mg | ORAL_TABLET | Freq: Two times a day (BID) | ORAL | 0 refills | Status: AC

## 2023-11-29 NOTE — ED Provider Notes (Signed)
 [facility] EMERGENCY DEPARTMENT AT [facility] Provider Note   CSN: [id number] Arrival date & time: 11/28/23  2052     History  Chief Complaint  Patient presents with   Back Pain   Dysuria    Helen Arnold is a 33 y.o. female.  The history is provided by the patient.  Back Pain Associated symptoms: dysuria   Dysuria Helen Arnold is a 33 y.o. female who presents to the Emergency Department complaining of back pain and dysuria.  She has been battling urinary tract infections for the last 2 months with intermittent dysuria and lower abdominal pain.  She was initially treated with Macrobid and then followed by Bactrim.  She has been manage to urgent care.  About 2 days ago she developed associated vomiting and diarrhea.  Over the last 2 days she developed bilateral low back pain that radiates to the pelvis with a temperature to 99 and 100 for the last 24 hours.  She thinks that vomiting and diarrhea are secondary to starting Pyridium, which she stopped after she developed the symptoms.  She has associated urinary frequency and dark urine.  She has had STI testing performed, which was negative as well as blood work, which was negative.  She has no known medical problems. Completed abx 1 week ago.     Home Medications Prior to Admission medications   Medication Sig Start Date End Date Taking? Authorizing Provider  acetaminophen  (TYLENOL ) 325 MG tablet Take 650 mg by mouth every 6 (six) hours as needed.   Yes [provider]  brompheniramine-pseudoephedrine -DM 30-2-10 MG/5ML syrup Take by mouth. 10/02/23  Yes [provider]  ciprofloxacin  (CIPRO ) 500 MG tablet Take 1 tablet (500 mg total) by mouth every 12 (twelve) hours. 11/29/23  Yes Griselda Norris, MD  fluconazole (DIFLUCAN) 150 MG tablet Take 150 mg by mouth once. 11/15/23  Yes [provider]  HYDROcodone -acetaminophen  (NORCO/VICODIN) 5-325 MG tablet Take 1 tablet by mouth every 6 (six) hours as  needed. 11/29/23  Yes Griselda Norris, MD  methylPREDNISolone (MEDROL DOSEPAK) 4 MG TBPK tablet Take by mouth See admin instructions. 10/02/23  Yes [provider]  mirtazapine (REMERON) 7.5 MG tablet Take 1 tablet by mouth at bedtime. 07/29/23  Yes [provider]  nitrofurantoin, macrocrystal-monohydrate, (MACROBID) 100 MG capsule Take 100 mg by mouth 2 (two) times daily. 11/01/23  Yes [provider]  norethindrone-ethinyl estradiol  (LOESTRIN) 1-20 MG-MCG tablet Take 1 tablet by mouth daily.   Yes [provider]  ondansetron  (ZOFRAN -ODT) 4 MG disintegrating tablet Take 1 tablet (4 mg total) by mouth every 8 (eight) hours as needed. 11/29/23  Yes Griselda Norris, MD  sulfamethoxazole-trimethoprim (BACTRIM DS) 800-160 MG tablet Take 1 tablet by mouth 2 (two) times daily. 11/15/23  Yes [provider]  traMADol (ULTRAM) 50 MG tablet Take 50 mg by mouth every 6 (six) hours as needed for moderate pain (pain score 4-6) or severe pain (pain score 7-10). 11/25/23 11/30/23 Yes [provider]  ZEPBOUND 12.5 MG/0.5ML Pen Inject 12.5 mg into the skin once a week. 11/25/23  Yes [provider]  albuterol  (VENTOLIN  HFA) 108 (90 Base) MCG/ACT inhaler Inhale 2 puffs into the lungs every 4 to 6 hours. 11/07/21     fluticasone (FLONASE) 50 MCG/ACT nasal spray Place 1 spray into both nostrils daily.    [provider]  ibuprofen  (ADVIL ) 800 MG tablet Take 1 tablet (800 mg total) by mouth every 8 (eight) hours as needed. 09/05/20  Henson, Vickie L, NP-C  methocarbamol  (ROBAXIN ) 500 MG tablet Take 1 tablet (500 mg total) by mouth 2 (two) times daily. 09/05/20   Henson, Vickie L, NP-C  phenazopyridine (PYRIDIUM) 200 MG tablet Take 200 mg by mouth 3 (three) times daily with meals. 11/25/23 11/30/23  [provider]  predniSONE  (STERAPRED UNI-PAK 21 TAB) 5 MG (21) TBPK tablet Use as directed within package instructions. 11/07/21   [employer], PA-C   promethazine -dextromethorphan (PROMETHAZINE -DM) 6.25-15 MG/5ML syrup Take 5 ml by mouth every 4 to 6 hours as needed for cough. 11/07/21         Allergies    Patient has no known allergies.    Review of Systems   Review of Systems  Genitourinary:  Positive for dysuria.  Musculoskeletal:  Positive for back pain.  All other systems reviewed and are negative.   Physical Exam Updated Vital Signs BP (!) 117/94   Pulse (!) 102   Temp 98.2 F (36.8 C) (Oral)   Resp 16   Wt 76.2 kg   SpO2 99%   BMI 31.72 kg/m  Physical Exam Vitals and nursing note reviewed.  Constitutional:      Appearance: She is well-developed.  HENT:     Head: Normocephalic and atraumatic.  Cardiovascular:     Rate and Rhythm: Normal rate and regular rhythm.  Pulmonary:     Effort: Pulmonary effort is normal.     Breath sounds: Normal breath sounds.  Abdominal:     Palpations: Abdomen is soft.     Tenderness: There is no guarding or rebound.     Comments: Mild suprapubic tenderness  Musculoskeletal:        General: No tenderness.  Skin:    General: Skin is warm and dry.  Neurological:     Mental Status: She is alert and oriented to person, place, and time.  Psychiatric:        Behavior: Behavior normal.     ED Results / Procedures / Treatments   Labs (all labs ordered are listed, but only abnormal results are displayed) Labs Reviewed  COMPREHENSIVE METABOLIC PANEL - Abnormal; Notable for the following components:      Result Value   Potassium 3.4 (*)    Total Protein 8.8 (*)    All other components within normal limits  URINALYSIS, ROUTINE W REFLEX MICROSCOPIC - Abnormal; Notable for the following components:   Color, Urine AMBER (*)    APPearance CLOUDY (*)    Hgb urine dipstick MODERATE (*)    Ketones, ur 20 (*)    Protein, ur 100 (*)    Nitrite POSITIVE (*)    Leukocytes,Ua MODERATE (*)    Bacteria, UA RARE (*)    All other components within normal limits  URINE CULTURE  CBC WITH  DIFFERENTIAL/PLATELET  HCG, SERUM, QUALITATIVE    EKG None  Radiology CT ABDOMEN PELVIS W CONTRAST Result Date: 11/29/2023 CLINICAL DATA:  33 year old female with history of acute onset of nonlocalized abdominal pain and painful urination. Evaluate for kidney stone. EXAM: CT ABDOMEN AND PELVIS WITH CONTRAST TECHNIQUE: Multidetector CT imaging of the abdomen and pelvis was performed using the standard protocol following bolus administration of intravenous contrast. RADIATION DOSE REDUCTION: This exam was performed according to the departmental dose-optimization program which includes automated exposure control, adjustment of the mA and/or kV according to patient size and/or use of iterative reconstruction technique. CONTRAST:  OMNIPAQUE  IOHEXOL  300 MG/ML  SOLN COMPARISON:  No priors. FINDINGS: Lower chest: Unremarkable. Hepatobiliary:  No suspicious cystic or solid hepatic lesions. No intra or extrahepatic biliary ductal dilatation. Gallbladder is unremarkable in appearance. Pancreas: No pancreatic mass. No pancreatic ductal dilatation. No pancreatic or peripancreatic fluid collections or inflammatory changes. Spleen: Unremarkable. Adrenals/Urinary Tract: Bilateral kidneys and adrenal glands are normal in appearance. No hydroureteronephrosis. No definite urinary tract calculi identified on today's contrast enhanced CT examination. Urinary bladder is normal in appearance. Stomach/Bowel: The appearance of the stomach is normal. No pathologic dilatation of small bowel or colon. Normal appendix. Vascular/Lymphatic: No significant atherosclerotic disease, aneurysm or dissection noted in the abdominal or pelvic vasculature. No lymphadenopathy noted in the abdomen or pelvis. Reproductive: Uterus and ovaries are unremarkable in appearance. Other: No significant volume of ascites.  No pneumoperitoneum. Musculoskeletal: There are no aggressive appearing lytic or blastic lesions noted in the visualized portions of  the skeleton. IMPRESSION: 1. No acute findings are noted in the abdomen or pelvis to account for the patient's symptoms. Electronically Signed   By: Toribio Aye M.D.   On: 11/29/2023 05:05    Procedures Procedures    Medications Ordered in ED Medications  ciprofloxacin  (CIPRO ) tablet 500 mg (has no administration in time range)  ketorolac  (TORADOL ) 15 MG/ML injection 15 mg (15 mg Intravenous Given 11/29/23 0423)  iohexol  (OMNIPAQUE ) 300 MG/ML solution 100 mL (100 mLs Intravenous Contrast Given 11/29/23 0437)    ED Course/ Medical Decision Making/ A&P                                 Medical Decision Making Amount and/or Complexity of Data Reviewed Labs: ordered. Radiology: ordered.  Risk Prescription drug management.   Patient here for release of flank pain, vomiting, diarrhea and dysuria.  She has been treated with 2 rounds of antibiotics for UTI, last completed her course 1 week ago.  UA is concerning for UTI.  CT scan is negative for obstructing stone, perinephric abscess.  BMP with normal renal function.  CBC is unremarkable.  Pain is improved after medications in the department.  Will send a urine culture.  Will start on antibiotics given her symptoms.  Discussed with patient home care for recurrent UTI.  Discussed urology follow-up as well as return precautions.       Final Clinical Impression(s) / ED Diagnoses Final diagnoses:  Acute UTI    Rx / DC Orders ED Discharge Orders          Ordered    ciprofloxacin  (CIPRO ) 500 MG tablet  Every 12 hours        11/29/23 0522    ondansetron  (ZOFRAN -ODT) 4 MG disintegrating tablet  Every 8 hours PRN        11/29/23 0522    HYDROcodone -acetaminophen  (NORCO/VICODIN) 5-325 MG tablet  Every 6 hours PRN        11/29/23 0527              Griselda Norris, MD 11/29/23 [phone]

## 2023-11-30 LAB — URINE CULTURE

## 2024-07-16 ENCOUNTER — Other Ambulatory Visit (HOSPITAL_COMMUNITY): Payer: Self-pay

## 2024-07-17 ENCOUNTER — Other Ambulatory Visit (HOSPITAL_BASED_OUTPATIENT_CLINIC_OR_DEPARTMENT_OTHER): Payer: Self-pay

## 2024-07-18 ENCOUNTER — Other Ambulatory Visit (HOSPITAL_COMMUNITY): Payer: Self-pay

## 2024-07-18 MED ORDER — ESTRADIOL 0.1 MG/24HR TD PTWK
MEDICATED_PATCH | TRANSDERMAL | 3 refills | Status: AC
  Filled 2024-07-18: qty 4, 22d supply, fill #0

## 2024-07-19 ENCOUNTER — Other Ambulatory Visit (HOSPITAL_COMMUNITY): Payer: Self-pay
